# Patient Record
Sex: Female | Born: 1990 | Race: White | Hispanic: No | Marital: Married | Smoking: Former smoker
Health system: Southern US, Community
[De-identification: ages and names within clinical notes are randomized; demographics above are authoritative.]

## PROBLEM LIST (undated history)

## (undated) DIAGNOSIS — O1490 Unspecified pre-eclampsia, unspecified trimester: Secondary | ICD-10-CM

## (undated) DIAGNOSIS — I509 Heart failure, unspecified: Secondary | ICD-10-CM

## (undated) DIAGNOSIS — G43009 Migraine without aura, not intractable, without status migrainosus: Secondary | ICD-10-CM

## (undated) DIAGNOSIS — T7840XA Allergy, unspecified, initial encounter: Secondary | ICD-10-CM

## (undated) HISTORY — DX: Migraine without aura, not intractable, without status migrainosus: G43.009

## (undated) HISTORY — DX: Allergy, unspecified, initial encounter: T78.40XA

---

## 2013-01-22 ENCOUNTER — Emergency Department: Payer: Self-pay | Admitting: Emergency Medicine

## 2013-01-22 LAB — URINALYSIS, COMPLETE
Bilirubin,UR: NEGATIVE
Glucose,UR: NEGATIVE mg/dL (ref 0–75)
Ph: 7 (ref 4.5–8.0)
Protein: NEGATIVE
Squamous Epithelial: 4

## 2013-01-23 LAB — HCG, QUANTITATIVE, PREGNANCY: Beta Hcg, Quant.: 22288 m[IU]/mL — ABNORMAL HIGH

## 2013-01-23 LAB — GC/CHLAMYDIA PROBE AMP

## 2013-01-23 LAB — WET PREP, GENITAL

## 2013-09-13 ENCOUNTER — Inpatient Hospital Stay: Payer: Self-pay | Admitting: Obstetrics & Gynecology

## 2013-09-13 LAB — PROTEIN / CREATININE RATIO, URINE
Creatinine, Urine: 44.1 mg/dL (ref 30.0–125.0)
PROTEIN/CREAT. RATIO: 2925 mg/g{creat} — AB (ref 0–200)
Protein, Random Urine: 129 mg/dL — ABNORMAL HIGH (ref 0–12)

## 2013-09-13 LAB — CBC WITH DIFFERENTIAL/PLATELET
BASOS ABS: 0.1 10*3/uL (ref 0.0–0.1)
BASOS PCT: 0.5 %
EOS ABS: 0 10*3/uL (ref 0.0–0.7)
EOS PCT: 0.3 %
HCT: 29.4 % — AB (ref 35.0–47.0)
HGB: 10.1 g/dL — ABNORMAL LOW (ref 12.0–16.0)
Lymphocyte #: 1.6 10*3/uL (ref 1.0–3.6)
Lymphocyte %: 14.8 %
MCH: 31.3 pg (ref 26.0–34.0)
MCHC: 34.2 g/dL (ref 32.0–36.0)
MCV: 92 fL (ref 80–100)
Monocyte #: 1.1 x10 3/mm — ABNORMAL HIGH (ref 0.2–0.9)
Monocyte %: 9.7 %
NEUTROS ABS: 8.2 10*3/uL — AB (ref 1.4–6.5)
Neutrophil %: 74.7 %
Platelet: 72 10*3/uL — ABNORMAL LOW (ref 150–440)
RBC: 3.21 10*6/uL — AB (ref 3.80–5.20)
RDW: 13.1 % (ref 11.5–14.5)
WBC: 10.9 10*3/uL (ref 3.6–11.0)

## 2013-09-13 LAB — GC/CHLAMYDIA PROBE AMP

## 2013-09-14 LAB — ALT: SGPT (ALT): 15 U/L (ref 12–78)

## 2013-09-14 LAB — PLATELET COUNT: Platelet: 66 10*3/uL — ABNORMAL LOW (ref 150–440)

## 2013-09-14 LAB — LACTATE DEHYDROGENASE: LDH: 222 U/L (ref 81–246)

## 2013-09-20 ENCOUNTER — Emergency Department: Payer: Self-pay | Admitting: Emergency Medicine

## 2013-09-20 LAB — CBC
HCT: 36.2 % (ref 35.0–47.0)
HGB: 11.7 g/dL — ABNORMAL LOW (ref 12.0–16.0)
MCH: 30 pg (ref 26.0–34.0)
MCHC: 32.2 g/dL (ref 32.0–36.0)
MCV: 93 fL (ref 80–100)
Platelet: 403 10*3/uL (ref 150–440)
RBC: 3.89 10*6/uL (ref 3.80–5.20)
RDW: 13.7 % (ref 11.5–14.5)
WBC: 25.1 10*3/uL — AB (ref 3.6–11.0)

## 2013-09-20 LAB — BASIC METABOLIC PANEL
ANION GAP: 12 (ref 7–16)
BUN: 10 mg/dL (ref 7–18)
CALCIUM: 8.3 mg/dL — AB (ref 8.5–10.1)
CHLORIDE: 103 mmol/L (ref 98–107)
CO2: 20 mmol/L — AB (ref 21–32)
Creatinine: 0.87 mg/dL (ref 0.60–1.30)
EGFR (Non-African Amer.): >60
Glucose: 166 mg/dL — ABNORMAL HIGH (ref 65–99)
OSMOLALITY: 273 (ref 275–301)
Potassium: 3.8 mmol/L (ref 3.5–5.1)
SODIUM: 135 mmol/L — AB (ref 136–145)

## 2013-09-20 LAB — APTT: Activated PTT: 28.4 secs (ref 23.6–35.9)

## 2013-09-20 LAB — TROPONIN I: TROPONIN-I: 0.1 ng/mL — AB

## 2013-09-20 LAB — PROTIME-INR
INR: 0.9
Prothrombin Time: 12 secs (ref 11.5–14.7)

## 2013-09-20 LAB — MAGNESIUM: MAGNESIUM: 1.7 mg/dL — AB

## 2013-09-22 LAB — URINE CULTURE

## 2015-05-22 IMAGING — CR DG CHEST 1V PORT
1 series · 1 of 1 positions shown · non-contrast
Comparison: Portable exam 0010 hr without priors for comparison

CLINICAL DATA: Shortness of breath, postpartum from delivery
09/14/2013 question pulmonary edema or pulmonary embolism

EXAM:
PORTABLE CHEST - 1 VIEW

[ap]
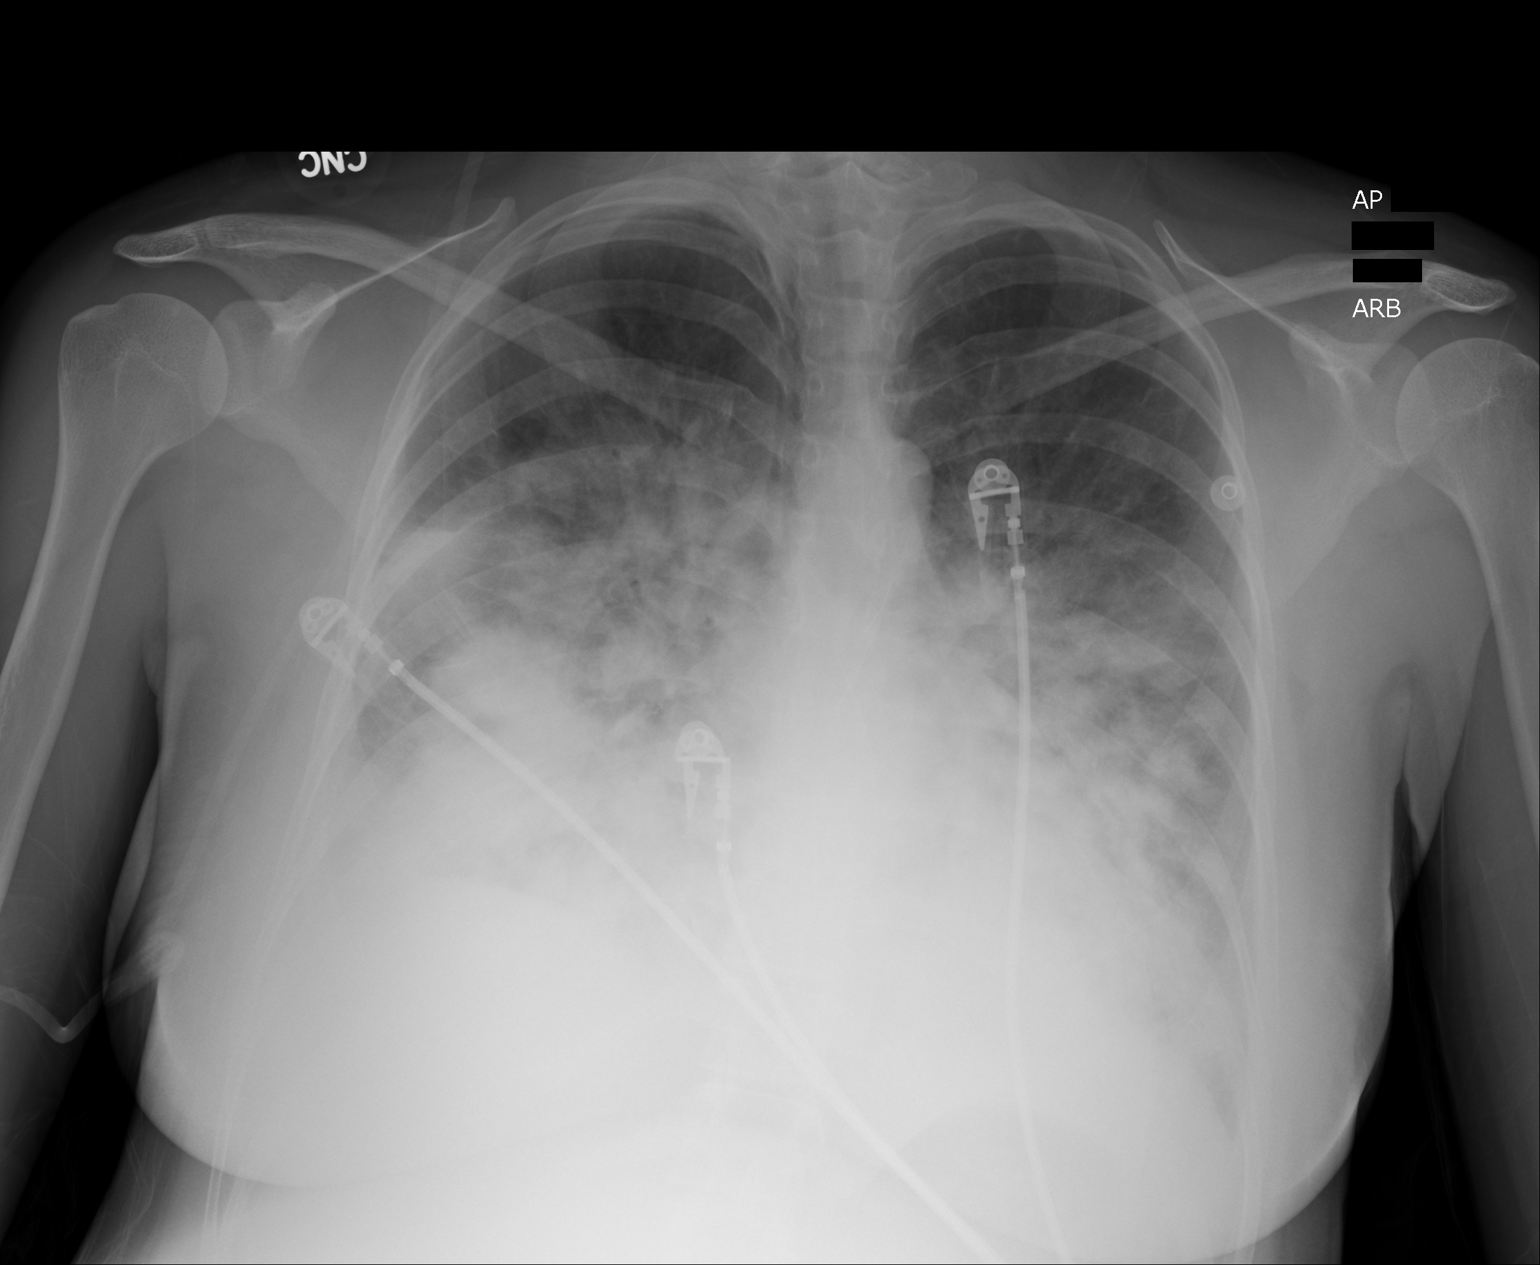

[1 of 1 positions shown; findings below may reference images not displayed]

FINDINGS: Upper normal heart size compatible with postpartum state.

Diffuse bilateral airspace infiltrates, greatest at the mid to lower
lungs.

Question minimal right pleural effusion.

No pneumothorax or acute osseous findings.
IMPRESSION: Diffuse bilateral airspace infiltrates at the mid to lower lungs and
question minimal right pleural effusion.

Differential diagnosis includes pulmonary edema and pneumonia,
pulmonary embolism not excluded, with amniotic fluid embolism
considered unlikely due to being 6 days post partum.

## 2016-11-03 ENCOUNTER — Encounter: Payer: Self-pay | Admitting: Emergency Medicine

## 2016-11-03 ENCOUNTER — Emergency Department
Admission: EM | Admit: 2016-11-03 | Discharge: 2016-11-04 | Disposition: A | Discharge status: Home / Self Care | Payer: Self-pay | Attending: Emergency Medicine | Admitting: Emergency Medicine

## 2016-11-03 DIAGNOSIS — J40 Bronchitis, not specified as acute or chronic: Secondary | ICD-10-CM | POA: Insufficient documentation

## 2016-11-03 DIAGNOSIS — J0100 Acute maxillary sinusitis, unspecified: Secondary | ICD-10-CM | POA: Insufficient documentation

## 2016-11-03 DIAGNOSIS — I509 Heart failure, unspecified: Secondary | ICD-10-CM | POA: Insufficient documentation

## 2016-11-03 DIAGNOSIS — Z87891 Personal history of nicotine dependence: Secondary | ICD-10-CM | POA: Insufficient documentation

## 2016-11-03 HISTORY — DX: Unspecified pre-eclampsia, unspecified trimester: O14.90

## 2016-11-03 HISTORY — DX: Heart failure, unspecified: I50.9

## 2016-11-03 MED ORDER — IPRATROPIUM-ALBUTEROL 0.5-2.5 (3) MG/3ML IN SOLN
3.0000 mL | Freq: Once | RESPIRATORY_TRACT | Status: AC
  Administered 2016-11-04: 3 mL via RESPIRATORY_TRACT
  Filled 2016-11-03: qty 3

## 2016-11-03 MED ORDER — BENZONATATE 100 MG PO CAPS
100.0000 mg | ORAL_CAPSULE | Freq: Once | ORAL | Status: AC
  Administered 2016-11-04: 100 mg via ORAL
  Filled 2016-11-03: qty 1

## 2016-11-03 MED ORDER — OXYMETAZOLINE HCL 0.05 % NA SOLN
1.0000 | Freq: Once | NASAL | Status: AC
  Administered 2016-11-04: 1 via NASAL
  Filled 2016-11-03: qty 15

## 2016-11-03 MED ORDER — BUTALBITAL-APAP-CAFFEINE 50-325-40 MG PO TABS
2.0000 | ORAL_TABLET | Freq: Once | ORAL | Status: AC
  Administered 2016-11-04: 2 via ORAL
  Filled 2016-11-03: qty 2

## 2016-11-03 NOTE — ED Triage Notes (Addendum)
Patient reports sore throat Wednesday.  Thursday with cough and sinus congestion and pain.  Reports cough productive with green sputum.  Lungs clear bilaterally.  No increase effort of breathing, no acute respiratory distress.

## 2016-11-03 NOTE — ED Provider Notes (Signed)
[facility] Emergency Department Provider Note   ____________________________________________   First MD Initiated Contact with Patient 11/03/16 2347     (approximate)  I have reviewed the triage vital signs and the nursing notes.   HISTORY  Chief Complaint Facial Pain and Anxiety    HPI Tonya Goodwin is a 26 y.o. female who comes into the hospital today with some headache, cough, shortness of breath and nasal congestion. The patient reports that she woke up on Wednesday with a sore throat. Thursday she was coughing and her chest felt tight. She reports that whenever she coughs she has pain in her chest. She also had some nasal congestion and facial pain. The patient reports that she's had a headache since Thursday although she's been taking Tylenol. She tried taking Coricidin HBP on Wednesday and Thursday but it did not help. The patient reports that she is starting to feel short of breath and feels like she cannot catch her breath. The patient rates her pain 8 out of 10 in intensity. She reports that her headache is constantly throbbing. She denies any sick contacts or fevers. She has had some bronchitis in the past but is concerned so she decided to come in to the hospital. She states that she has a lump in the back of her neck that is swollen and it hurts. She is here today for evaluation.   Past Medical History:  Diagnosis Date  . CHF (congestive heart failure) (HCC)   . Preeclampsia     There are no active problems to display for this patient.   History reviewed. No pertinent surgical history.  Prior to Admission medications   Medication Sig Start Date End Date Taking? Authorizing Provider  albuterol (PROVENTIL HFA;VENTOLIN HFA) 108 (90 Base) MCG/ACT inhaler Inhale 2 puffs into the lungs every 6 (six) hours as needed for wheezing or shortness of breath. 11/04/16   Rebecka Apley, MD  amoxicillin-clavulanate (AUGMENTIN) 875-125 MG tablet Take 1  tablet by mouth 2 (two) times daily. 11/04/16 11/14/16  Rebecka Apley, MD    Allergies Codeine  No family history on file.  Social History Social History  Substance Use Topics  . Smoking status: Former Games developer  . Smokeless tobacco: Not on file  . Alcohol use Yes    Review of Systems  Constitutional: No fever/chills Eyes: No visual changes. ENT: Sore throat, nasal congestion and facial pain Cardiovascular: Chest tightness Respiratory: Cough and shortness of breath. Gastrointestinal: No abdominal pain.  No nausea, no vomiting.  No diarrhea.  No constipation. Genitourinary: Negative for dysuria. Musculoskeletal: Negative for back pain. Skin: Negative for rash. Neurological: Headache   ____________________________________________   PHYSICAL EXAM:  VITAL SIGNS: ED Triage Vitals  Enc Vitals Group     BP 11/03/16 2116 130/87     Pulse Rate 11/03/16 2116 92     Resp 11/03/16 2116 18     Temp 11/03/16 2116 98.2 F (36.8 C)     Temp Source 11/03/16 2116 Oral     SpO2 11/03/16 2116 100 %     Weight 11/03/16 2115 140 lb (63.5 kg)     Height 11/03/16 2115  (1.6 m)     Head Circumference --      Peak Flow --      [facility]      Pain Edu? --      Excl. in GC? --     Constitutional:  Alert and oriented. Well appearing and in Mild distress. Eyes: Conjunctivae are normal. PERRL. EOMI. Head: Atraumatic. Nose: No congestion/rhinnorhea. Maxillary and ethmoid sinus tenderness to palpation Mouth/Throat: Mucous membranes are moist.  Oropharynx non-erythematous. Hematological/Lymphatic/Immunilogical: Tender left posterior cervical lymphadenopathy Cardiovascular: Normal rate, regular rhythm. Grossly normal heart sounds.  Good peripheral circulation. Respiratory: Normal respiratory effort.  No retractions. Lungs CTAB. Gastrointestinal: Soft and nontender. No distention. Positive bowel sounds Musculoskeletal: No lower extremity tenderness nor edema.     Neurologic:  Normal speech and language.  Skin:  Skin is warm, dry and intact.  Psychiatric: Mood and affect are normal.   ____________________________________________   LABS (all labs ordered are listed, but only abnormal results are displayed)  Labs Reviewed - No data to display ____________________________________________  EKG  ED ECG REPORT I, Rebecka Apley, the attending physician, personally viewed and interpreted this ECG.   Date: 11/04/2016  EKG Time: 0026  Rate: 96  Rhythm: normal sinus rhythm  Axis: normal  Intervals:none  ST&T Change: none  ____________________________________________  RADIOLOGY  CXR ____________________________________________   PROCEDURES  Procedure(s) performed: None  Procedures  Critical Care performed: No  ____________________________________________   INITIAL IMPRESSION / ASSESSMENT AND PLAN / ED COURSE  Pertinent labs & imaging results that were available during my care of the patient were reviewed by me and considered in my medical decision making (see chart for details).  This is a 26 year old female who comes into the hospital today with some nasal congestion and some chest tightness and some shortness of breath. The patient has been having symptoms since Wednesday. I will send the patient for a chest x-ray and I will also do an EKG on the patient given her history of CHF. I will give the patient some Afrin as well as Fioricet and a DuoNeb treatment. I feel that the patient has some sinusitis causing her symptoms. I will reassess the patient once I received her x-ray results and she's receive her medications.  Clinical Course as of Nov 04 56  Mon Nov 04, 2016  0041 No active cardiopulmonary disease. DG Chest 2 View [AW]    Clinical Course User Index [AW] Rebecka Apley, MD   The patient's breathing is improved with the Afrin as well as with the DuoNeb treatment. I will discharge the patient to home with some  medication for sinusitis and bronchitis. The patient should follow-up with the acute care clinic for further evaluation of her symptoms. The patient reports that she does feel a little bit jittery but I informed her that that does occur when she gets the albuterol treatment. Otherwise the patient has no other concerns. She'll be discharged home.  ____________________________________________   FINAL CLINICAL IMPRESSION(S) / ED DIAGNOSES  Final diagnoses:  Acute non-recurrent maxillary sinusitis  Bronchitis      NEW MEDICATIONS STARTED DURING THIS VISIT:  New Prescriptions   ALBUTEROL (PROVENTIL HFA;VENTOLIN HFA) 108 (90 BASE) MCG/ACT INHALER    Inhale 2 puffs into the lungs every 6 (six) hours as needed for wheezing or shortness of breath.   AMOXICILLIN-CLAVULANATE (AUGMENTIN) 875-125 MG TABLET    Take 1 tablet by mouth 2 (two) times daily.     Note:  This document was prepared using Dragon voice recognition software and may include unintentional dictation errors.    Rebecka Apley, MD 11/04/16 [phone]

## 2016-11-04 ENCOUNTER — Emergency Department: Payer: Self-pay

## 2016-11-04 MED ORDER — AMOXICILLIN-POT CLAVULANATE 875-125 MG PO TABS: 1.0000 | ORAL_TABLET | Freq: Two times a day (BID) | ORAL | 0 refills | Status: AC

## 2016-11-04 MED ORDER — ALBUTEROL SULFATE HFA 108 (90 BASE) MCG/ACT IN AERS: 2.0000 | INHALATION_SPRAY | Freq: Four times a day (QID) | RESPIRATORY_TRACT | 0 refills | Status: DC | PRN

## 2017-02-18 DIAGNOSIS — I429 Cardiomyopathy, unspecified: Secondary | ICD-10-CM | POA: Insufficient documentation

## 2017-08-13 ENCOUNTER — Ambulatory Visit: Payer: Self-pay

## 2018-05-12 LAB — HM PAP SMEAR: HM Pap smear: NEGATIVE

## 2018-07-06 IMAGING — CR DG CHEST 2V
2 series · 2 of 2 positions shown · non-contrast
Comparison: 09/20/2013

CLINICAL DATA: Sore throat on [REDACTED]

EXAM:
CHEST  2 VIEW

[chest pa]
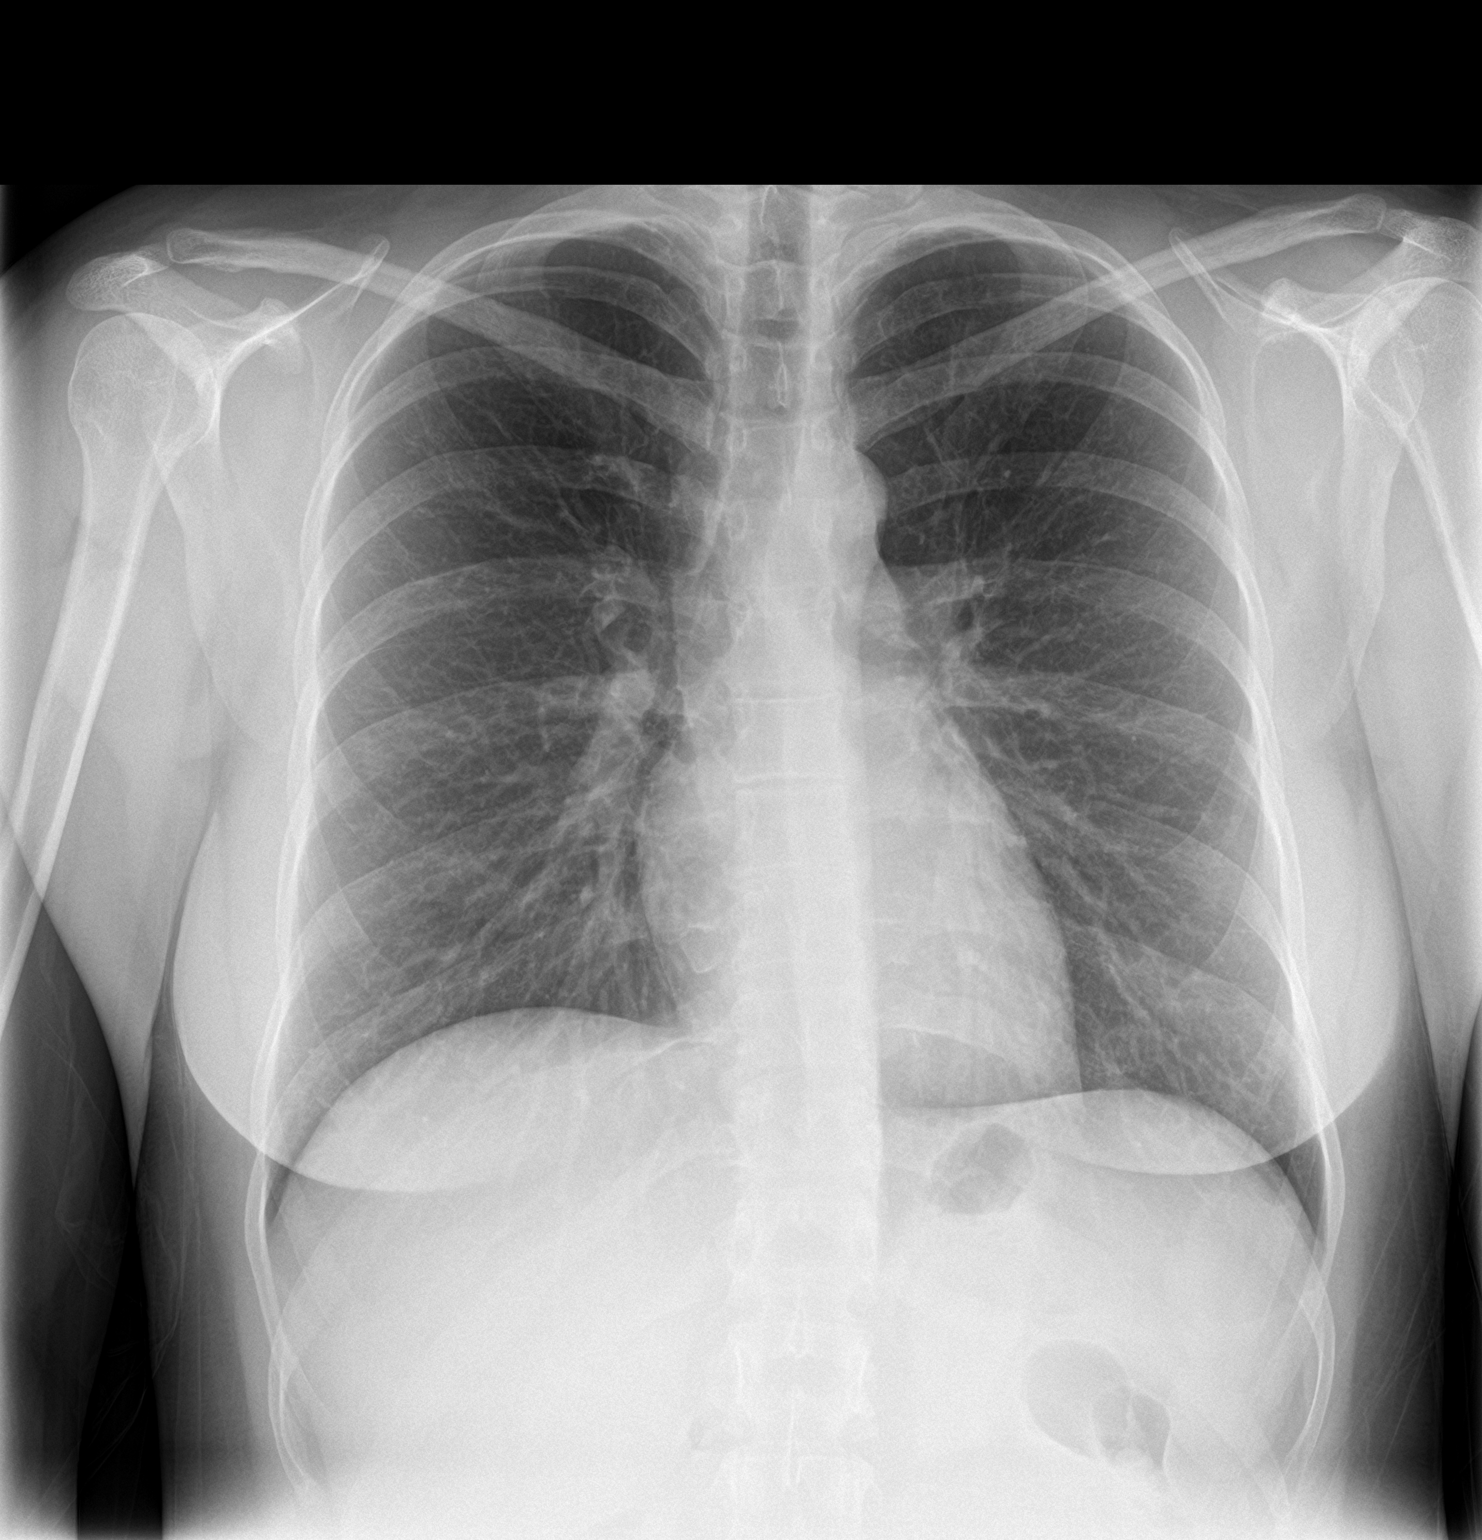

[chest lat]
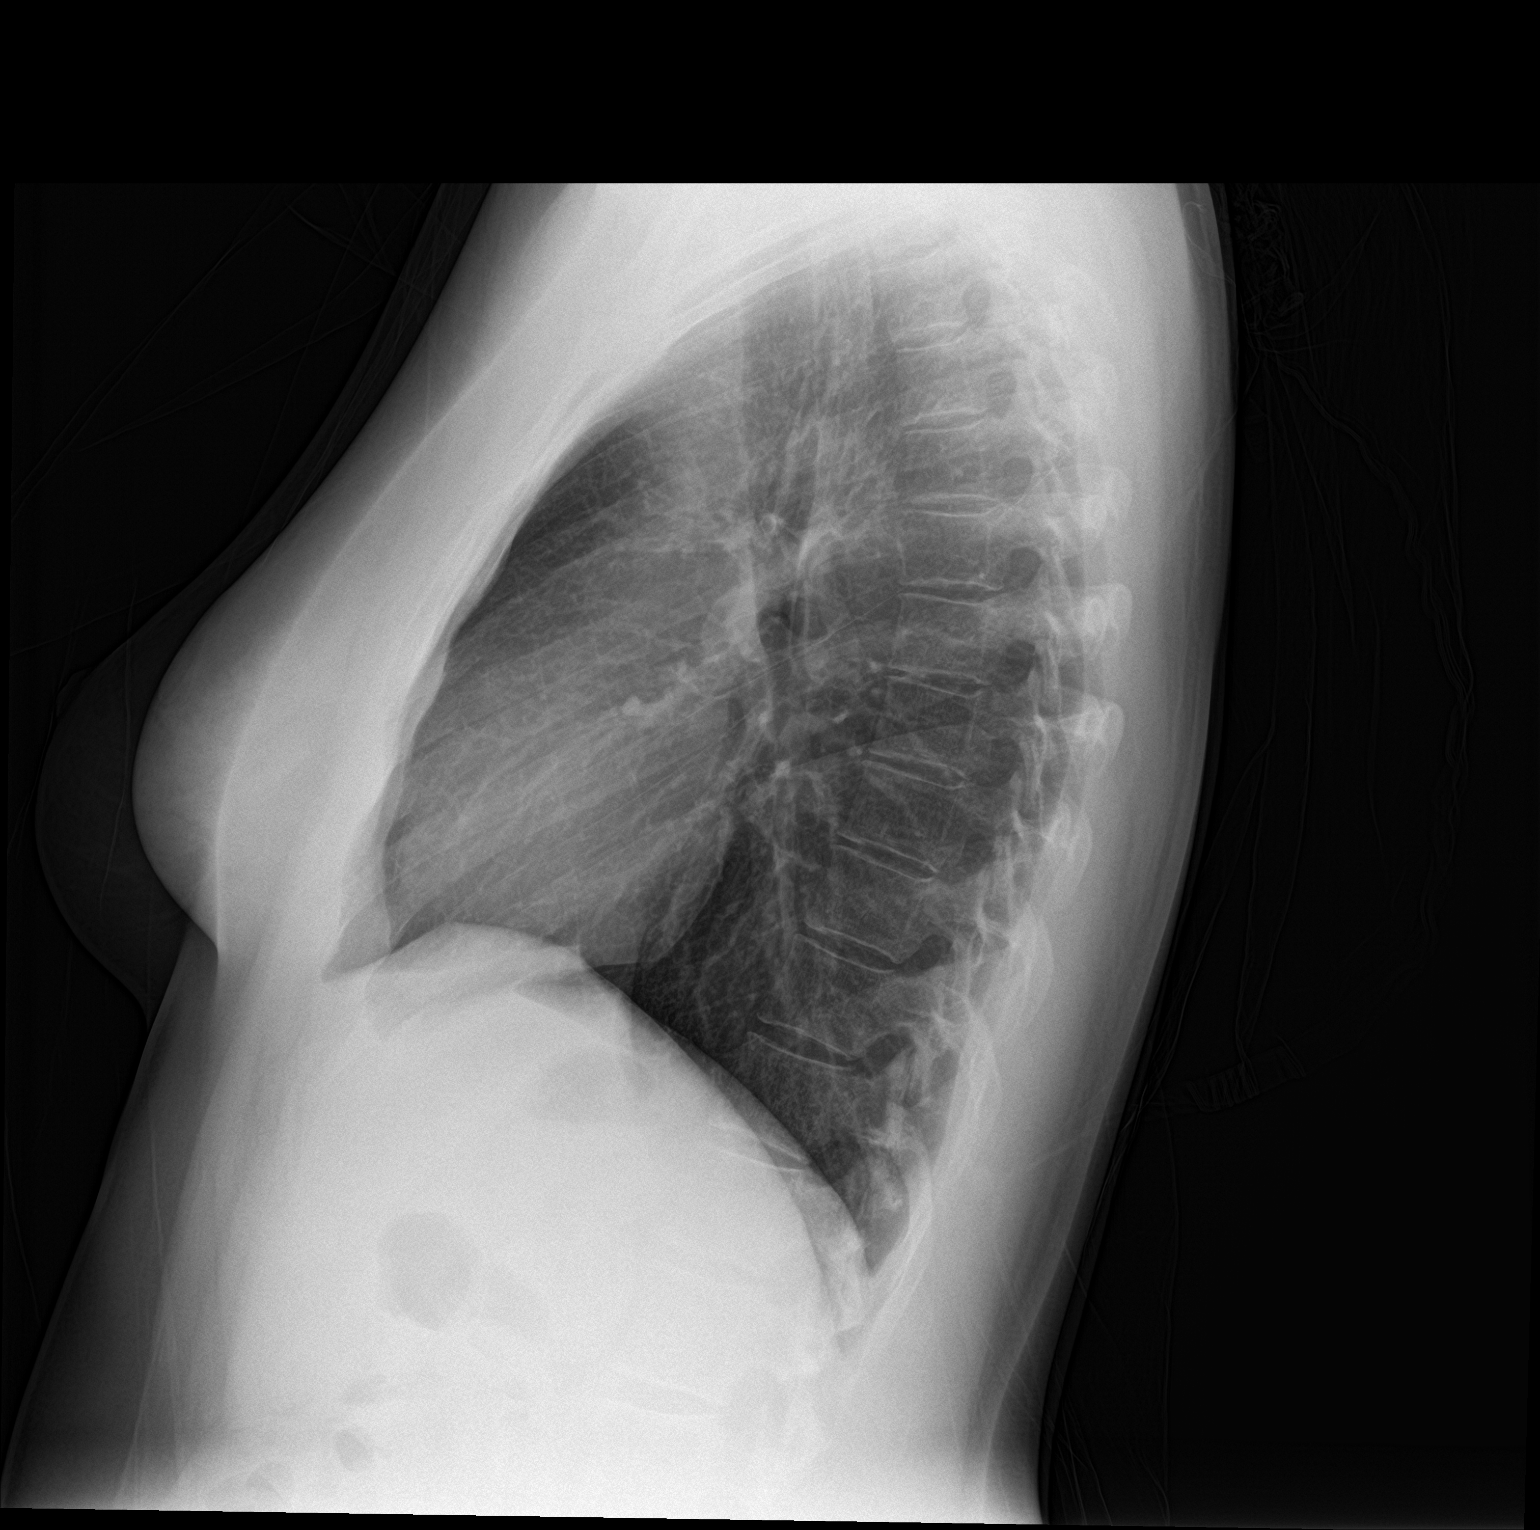

[2 of 2 positions shown; findings below may reference images not displayed]

FINDINGS: The heart size and mediastinal contours are within normal limits.
Both lungs are clear. The visualized skeletal structures are
unremarkable.
IMPRESSION: No active cardiopulmonary disease.

## 2019-02-03 ENCOUNTER — Other Ambulatory Visit: Payer: Self-pay

## 2019-02-03 ENCOUNTER — Ambulatory Visit (LOCAL_COMMUNITY_HEALTH_CENTER): Payer: Medicaid Other

## 2019-02-03 VITALS — BP 116/81 | Ht 63.0 in | Wt 169.5 lb

## 2019-02-03 DIAGNOSIS — Z3042 Encounter for surveillance of injectable contraceptive: Secondary | ICD-10-CM | POA: Diagnosis not present

## 2019-02-03 DIAGNOSIS — Z30013 Encounter for initial prescription of injectable contraceptive: Secondary | ICD-10-CM

## 2019-02-03 DIAGNOSIS — Z3009 Encounter for other general counseling and advice on contraception: Secondary | ICD-10-CM | POA: Diagnosis not present

## 2019-02-03 MED ORDER — MEDROXYPROGESTERONE ACETATE 150 MG/ML IM SUSP
150.0000 mg | Freq: Once | INTRAMUSCULAR | Status: AC
  Administered 2019-02-03: 150 mg via INTRAMUSCULAR

## 2019-02-03 NOTE — Progress Notes (Signed)
Physical at [facility] on 05/12/2018. Last depo at [facility] 10/28/2018; 14.0 weeks post depo. DMPA 150 mg IM per Jerline Pain, FNP order dated 05/12/2018.

## 2019-05-11 ENCOUNTER — Ambulatory Visit (LOCAL_COMMUNITY_HEALTH_CENTER): Payer: Medicaid Other

## 2019-05-11 ENCOUNTER — Other Ambulatory Visit: Payer: Self-pay

## 2019-05-11 VITALS — BP 119/81 | Ht 63.0 in | Wt 170.0 lb

## 2019-05-11 DIAGNOSIS — Z3009 Encounter for other general counseling and advice on contraception: Secondary | ICD-10-CM

## 2019-05-11 DIAGNOSIS — Z30013 Encounter for initial prescription of injectable contraceptive: Secondary | ICD-10-CM

## 2019-05-11 MED ORDER — MEDROXYPROGESTERONE ACETATE 150 MG/ML IM SUSP
150.0000 mg | Freq: Once | INTRAMUSCULAR | Status: AC
  Administered 2019-05-11: 150 mg via INTRAMUSCULAR

## 2019-05-11 MED ORDER — MULTI-VITAMIN/MINERALS PO TABS: 1.0000 | ORAL_TABLET | Freq: Every day | ORAL | 0 refills | Status: DC

## 2019-05-11 NOTE — Progress Notes (Signed)
Depo administered per 05/12/2018 written order of Jerline Pain FNP-BC. Administered in "right hip" per client request and injection tolerated without complaint. Folic acid counseling completed and MVI dispensed. Rich Number, RN

## 2019-07-29 ENCOUNTER — Other Ambulatory Visit: Payer: Self-pay | Admitting: Physician Assistant

## 2019-07-29 DIAGNOSIS — Z3042 Encounter for surveillance of injectable contraceptive: Secondary | ICD-10-CM

## 2019-07-29 MED ORDER — MEDROXYPROGESTERONE ACETATE 150 MG/ML IM SUSP
150.0000 mg | INTRAMUSCULAR | Status: DC
  Administered 2019-08-17: 10:00:00 150 mg via INTRAMUSCULAR

## 2019-07-29 NOTE — Progress Notes (Signed)
Per chart, patient with last RP 05/2018 and CBE and pap due 2022.  Provided that patient desires to continue with Depo and has no concerns, blood pressure is stable and is being followed for chronic conditions, can continue with Depo.

## 2019-07-30 ENCOUNTER — Ambulatory Visit: Payer: Medicaid Other

## 2019-08-17 ENCOUNTER — Other Ambulatory Visit: Payer: Self-pay

## 2019-08-17 ENCOUNTER — Ambulatory Visit (LOCAL_COMMUNITY_HEALTH_CENTER): Payer: Medicaid Other

## 2019-08-17 VITALS — BP 118/82 | Ht 63.0 in | Wt 174.0 lb

## 2019-08-17 DIAGNOSIS — Z30013 Encounter for initial prescription of injectable contraceptive: Secondary | ICD-10-CM

## 2019-08-17 DIAGNOSIS — Z3009 Encounter for other general counseling and advice on contraception: Secondary | ICD-10-CM | POA: Diagnosis not present

## 2019-08-17 NOTE — Progress Notes (Signed)
Depo given per Rolley Sims; tolerated well Richmond Campbell, RN

## 2019-11-18 ENCOUNTER — Other Ambulatory Visit: Payer: Self-pay

## 2019-11-18 ENCOUNTER — Ambulatory Visit (LOCAL_COMMUNITY_HEALTH_CENTER): Payer: Medicaid Other | Admitting: Physician Assistant

## 2019-11-18 ENCOUNTER — Encounter: Payer: Self-pay | Admitting: Physician Assistant

## 2019-11-18 VITALS — BP 120/81 | Ht 63.0 in | Wt 173.4 lb

## 2019-11-18 DIAGNOSIS — Z6830 Body mass index (BMI) 30.0-30.9, adult: Secondary | ICD-10-CM

## 2019-11-18 DIAGNOSIS — E669 Obesity, unspecified: Secondary | ICD-10-CM | POA: Insufficient documentation

## 2019-11-18 DIAGNOSIS — Z3009 Encounter for other general counseling and advice on contraception: Secondary | ICD-10-CM

## 2019-11-18 DIAGNOSIS — Z30013 Encounter for initial prescription of injectable contraceptive: Secondary | ICD-10-CM | POA: Diagnosis not present

## 2019-11-18 MED ORDER — MEDROXYPROGESTERONE ACETATE 150 MG/ML IM SUSP
150.0000 mg | INTRAMUSCULAR | Status: AC
  Administered 2019-11-18 – 2020-02-10 (×2): 150 mg via INTRAMUSCULAR

## 2019-11-18 MED ORDER — MULTIVITAMINS PO CAPS: 1.0000 | ORAL_CAPSULE | Freq: Every day | ORAL | 0 refills | Status: DC

## 2019-11-18 NOTE — Progress Notes (Signed)
Here today for RP and Depo. Last PE and Pap here was 05/12/2018. Last Depo was 08/17/2019 (13.2 weeks.). Declines STD screening today. Tawny Hopping, RN

## 2019-11-18 NOTE — Progress Notes (Signed)
Depo given and tolerated well. Accepted MVI's Tawny Hopping, RN

## 2019-11-18 NOTE — Progress Notes (Signed)
Family Planning Visit- Repeat Yearly Visit  Subjective:  Tonya Goodwin is a 29 y.o. G1P1001  being seen today for an well woman visit and to discuss family planning options.    She is currently using Depo-Provera injections for pregnancy prevention. Patient reports she does not want a pregnancy in the next year. Patient  has Cardiomyopathy (HCC) and Class 1 obesity with body mass index (BMI) of 30.0 to 30.9 in adult on their problem list.  Chief Complaint  Patient presents with  . Gynecologic Exam  . Contraception    Patient reports she feels well today. Wants to continue DMPA. Likes not having periods. Husband is considering vasectomy. Had routine cardiology eval recently for f/u postpartum cardiomyopathy (dx after 2105 delivery), which was deemed stable and meds were continued.  Patient denies concerns.   See flowsheet for other program required questions.   Body mass index is 30.72 kg/m. - Patient is eligible for diabetes screening based on BMI and age >58?  no HA1C ordered? not applicable  Patient reports 1 of partners in last year. Desires STI screening?  No - no exposure or symptoms   Has patient been screened once for HCV in the past?  No   No results found for: HCVAB  Does the patient have current of drug use, have a partner with drug use, and/or has been incarcerated since last result? No  If yes-- Screen for HCV through [facility]   Does the patient meet criteria for HBV testing? No  Criteria:  -Household, sexual or needle sharing contact with HBV -History of drug use -HIV positive -Those with known Hep C  ROS - see Routine Health Survey. Health Maintenance Due  Topic Date Due  . HIV Screening  Never done  . COVID-19 Vaccine (1) Never done    ROS  The following portions of the patient's history were reviewed and updated as appropriate: allergies, current medications, past family history, past medical history, past social history, past surgical history and  problem list. Problem list updated.  Objective:   Vitals:   11/18/19 1325  BP: 120/81  Weight: 173 lb 6.4 oz (78.7 kg)  Height: 5\' 3"  (1.6 m)    Physical Exam Vitals and nursing note reviewed.  Constitutional:      Appearance: She is obese.  HENT:     Head: Normocephalic.  Cardiovascular:     Rate and Rhythm: Normal rate and regular rhythm.     Heart sounds: Normal heart sounds. No murmur.  Pulmonary:     Effort: Pulmonary effort is normal. No respiratory distress.     Breath sounds: Normal breath sounds. No wheezing.  Abdominal:     General: Bowel sounds are normal.     Palpations: Abdomen is soft.     Tenderness: There is no abdominal tenderness. There is no guarding.     Hernia: No hernia is present.  Musculoskeletal:        General: Normal range of motion.     Cervical back: Normal range of motion.  Lymphadenopathy:     Cervical: No cervical adenopathy.  Skin:    General: Skin is warm and dry.     Findings: No lesion or rash.  Neurological:     Mental Status: She is alert and oriented to person, place, and time.  Psychiatric:        Behavior: Behavior normal.        Thought Content: Thought content normal.  Judgment: Judgment normal.    Declines pelvic/breast exam.   Assessment and Plan:  Tonya Goodwin is a 29 y.o. female [place] presenting to the [facility] Department for an yearly well woman exam/family planning visit  Contraception counseling: Reviewed all forms of birth control options in the tiered based approach. available including abstinence; over the counter/barrier methods; hormonal contraceptive medication including pill, patch, ring, injection,contraceptive implant, ECP; hormonal and nonhormonal IUDs; permanent sterilization options including vasectomy and the various tubal sterilization modalities. Risks, benefits, and typical effectiveness rates were reviewed.  Questions were answered.  Written information was also given to the  patient to review.  Patient desires DMPA, this was prescribed for patient. She will follow up in  3 mo for surveillance.  She was told to call with any further questions, or with any concerns about this method of contraception.  Emphasized use of condoms 100% of the time for STI prevention.  Patient was not offered ECP.   1. Family planning services Continue DMPA 150 mg IM today and repeat every 3 mo for 1 year. Discussed potential to discontinue if husband proceeds with vasectomy and has appropriate f/u sperm counts. Enc to establish PCP. Continue MVI with folic acid. Next Pap due 05/2021. - medroxyPROGESTERone (DEPO-PROVERA) injection 150 mg  2. Class 1 obesity with body mass index (BMI) of 30.0 to 30.9 in adult, unspecified obesity type, unspecified whether serious comorbidity present Enc diet/exercise and f/u with PCP.   Return in about 3 months (around 02/18/2020) for Routine DMPA injection.  No future appointments.  Lora Havens, PA-C

## 2020-02-10 ENCOUNTER — Other Ambulatory Visit: Payer: Self-pay

## 2020-02-10 ENCOUNTER — Ambulatory Visit (LOCAL_COMMUNITY_HEALTH_CENTER): Payer: Medicaid Other

## 2020-02-10 VITALS — BP 117/86 | Ht 63.0 in | Wt 174.0 lb

## 2020-02-10 DIAGNOSIS — Z3042 Encounter for surveillance of injectable contraceptive: Secondary | ICD-10-CM

## 2020-02-10 DIAGNOSIS — Z3009 Encounter for other general counseling and advice on contraception: Secondary | ICD-10-CM

## 2020-02-10 DIAGNOSIS — Z30013 Encounter for initial prescription of injectable contraceptive: Secondary | ICD-10-CM

## 2020-02-10 MED ORDER — MULTIVITAMINS PO CAPS: 1.0000 | ORAL_CAPSULE | Freq: Every day | ORAL | 0 refills | Status: AC

## 2020-02-10 NOTE — Progress Notes (Signed)
DMPA 150mg  given IM today per 11/18/2019 order by 11/20/2019, PA (thru 11/17/2020) and tolerated well. Patient is 12w 0d post last Depo. 11/19/2020, RN

## 2020-12-28 ENCOUNTER — Telehealth: Payer: Self-pay

## 2020-12-28 NOTE — Telephone Encounter (Signed)
Pt calling; wants a call back.  [phone]  Adv I could not adv her b/c we haven't seen her in over three years.

## 2021-08-15 ENCOUNTER — Ambulatory Visit: Payer: Self-pay | Admitting: Internal Medicine

## 2021-08-20 ENCOUNTER — Ambulatory Visit: Payer: Self-pay | Admitting: Internal Medicine

## 2021-12-26 ENCOUNTER — Encounter: Payer: Self-pay | Admitting: Physician Assistant

## 2021-12-26 ENCOUNTER — Ambulatory Visit (INDEPENDENT_AMBULATORY_CARE_PROVIDER_SITE_OTHER): Payer: Medicaid Other | Admitting: Physician Assistant

## 2021-12-26 VITALS — BP 116/79 | HR 85 | Temp 98.3°F | Resp 16 | Wt 166.7 lb

## 2021-12-26 DIAGNOSIS — B353 Tinea pedis: Secondary | ICD-10-CM

## 2021-12-26 DIAGNOSIS — O903 Peripartum cardiomyopathy: Secondary | ICD-10-CM | POA: Diagnosis not present

## 2021-12-26 DIAGNOSIS — B351 Tinea unguium: Secondary | ICD-10-CM

## 2021-12-26 DIAGNOSIS — L719 Rosacea, unspecified: Secondary | ICD-10-CM

## 2021-12-26 DIAGNOSIS — E669 Obesity, unspecified: Secondary | ICD-10-CM | POA: Diagnosis not present

## 2021-12-26 NOTE — Progress Notes (Signed)
I,Kensley Valladares Robinson,acting as a Education administrator for [employer], PA-C.,have documented all relevant documentation on the behalf of Tonya Speak, PA-C,as directed by  [employer], PA-C while in the presence of [employer], PA-C.  New Patient Office Visit  Subjective    Patient ID: Tonya Goodwin, female    DOB: 06/17/1991  Age: 31 y.o. MRN: [id number]  CC:  Chief Complaint  Patient presents with   Establish Care    HPI [name] presents to establish care.  Patient has no complaints today and is aware she is due for pap.    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 Her son is 14 y.o.  Outpatient Encounter Medications as of 12/26/2021  Medication Sig   carvedilol (COREG) 3.125 MG tablet Take 3.125 mg by mouth 2 (two) times daily with a meal.   lisinopril (ZESTRIL) 5 MG tablet Take 5 mg by mouth daily. Pt unsure of dosage   Multiple Vitamin (MULTIVITAMIN) capsule Take 1 capsule by mouth daily.   albuterol (PROVENTIL HFA;VENTOLIN HFA) 108 (90 Base) MCG/ACT inhaler Inhale 2 puffs into the lungs every 6 (six) hours as needed for wheezing or shortness of breath.   Multiple Vitamins-Minerals (MULTIVITAMIN WITH MINERALS) tablet Take 1 tablet by mouth daily.   No facility-administered encounter medications on file as of 12/26/2021.    Past Medical History:  Diagnosis Date   Allergy    CHF (congestive heart failure) (HCC)    Migraine headache without aura    Preeclampsia     Past Surgical History:  Procedure Laterality Date   CESAREAN SECTION      Family History  Problem Relation Age of Onset   Heart disease Mother    Hypertension Father    Migraines Maternal Grandmother    Heart disease Maternal Grandfather     Social History   Socioeconomic History   Marital status: Married    Spouse name: Not on file   Number of children: 1   Years of education: 10   Highest education level: Not on file   Occupational History   Not on file  Tobacco Use   Smoking status: Former   Smokeless tobacco: Never  Substance and Sexual Activity   Alcohol use: Yes   Drug use: Never   Sexual activity: Yes    Comment: vesectomy  Other Topics Concern   Not on file  Social History Narrative   Not on file   Social Determinants of Health   Financial Resource Strain: Not on file  Food Insecurity: Not on file  Transportation Needs: Not on file  Physical Activity: Not on file  Stress: Not on file  Social Connections: Not on file  Intimate Partner Violence: Not At Risk (11/18/2019)   Humiliation, Afraid, Rape, and Kick questionnaire    Fear of Current or Ex-Partner: No    Emotionally Abused: No    Physically Abused: No    Sexually Abused: No    Review of Systems  All other systems reviewed and are negative.    Objective    BP 116/79 (BP Location: Right Arm, Patient Position: Sitting, Cuff Size: Normal)   Pulse 85   Temp 98.3 F (36.8 C) (Oral)   Resp 16   Wt 166 lb 11.2 oz (75.6 kg)   SpO2 100%   BMI 29.53 kg/m   Physical Exam Vitals reviewed.  Constitutional:      General: She is not in  acute distress.    Appearance: Normal appearance. She is well-developed. She is obese. She is not diaphoretic.  HENT:     Head: Normocephalic and atraumatic.     Right Ear: Tympanic membrane, ear canal and external ear normal.     Left Ear: Tympanic membrane, ear canal and external ear normal.     Nose: Nose normal.     Mouth/Throat:     Mouth: Mucous membranes are moist.     Pharynx: Oropharynx is clear. No oropharyngeal exudate.  Eyes:     General: No scleral icterus.    Conjunctiva/sclera: Conjunctivae normal.     Pupils: Pupils are equal, round, and reactive to light.  Neck:     Thyroid: No thyromegaly.  Cardiovascular:     Rate and Rhythm: Normal rate and regular rhythm.     Pulses: Normal pulses.     Heart sounds: Normal heart sounds. No murmur heard. Pulmonary:     Effort:  Pulmonary effort is normal. No respiratory distress.     Breath sounds: Normal breath sounds. No wheezing or rales.  Abdominal:     General: Abdomen is flat. Bowel sounds are normal. There is no distension.     Palpations: Abdomen is soft.     Tenderness: There is no abdominal tenderness.  Musculoskeletal:        General: No deformity. Normal range of motion.     Cervical back: Normal range of motion and neck supple.     Right lower leg: No edema.     Left lower leg: No edema.  Lymphadenopathy:     Cervical: No cervical adenopathy.  Skin:    General: Skin is warm and dry.     Findings: No rash.     Comments:  persistent erythema with telangiectases/face  pruritic erythematous erosion/scales between toes, extension onto soles, sides/dorsum of left foot (moccasin distribution)  Firm, minimally rough, 19mm, follicle-based papules, some with perilesional erythema. Symmetric. Located on the lateral-proximal aspects of the arms   Neurological:     General: No focal deficit present.     Mental Status: She is alert and oriented to person, place, and time. Mental status is at baseline.     Sensory: No sensory deficit.     Motor: No weakness.     Gait: Gait normal.  Psychiatric:        Mood and Affect: Mood normal.        Behavior: Behavior normal.        Thought Content: Thought content normal.        Judgment: Judgment normal.         Assessment & Plan:   Problem List Items Addressed This Visit   None  1. Obesity (BMI 30-39.9) BMI ~ 30. Initial workup: - CBC with Differential/Platelet - Comprehensive metabolic panel - TSH Lifestyle modifications suggested via healthy diet and regular exercise  2. Tinea pedis, left Advised to use Aluminum acetate soak (Burow's solution; [place]ome[place], 1 pack to 1 quart warm water) to decrease itching and acute eczematous reaction Antifungal cream of choice BID after soaks (allylamines slightly more effective than azoles)Advised to use  -  Aluminum acetate soak (Burow's solution; [place]boro, 1 pack to 1 quart warm water) to decrease itching and acute eczematous reaction  - Antifungal cream of choice BID after soaks (allylamines slightly more effective than azoles) -  Perform careful removal of dead/thickened skin after soaking or bathing - Treat shoes with antifungal powders - Avoid occlusive footwear Perform careful removal  of dead/thickened skin after soaking or bathing Treat shoes with antifungal powders Avoid occlusive footwear  3. Onychomycosis Advised to use  - Aluminum acetate soak (Burow's solution; [place]omeboro, 1 pack to 1 quart warm water) to decrease itching and acute eczematous reaction  - Antifungal cream of choice BID after soaks (allylamines slightly more effective than azoles) -  Perform careful removal of dead/thickened skin after soaking or bathing - Treat shoes with antifungal powders - Avoid occlusive footwear  4. Postpartum cardiomyopathy Chronic. Stable Continue current regimen Managed by Cardiology  5. Rosacea Recommended: -proper skin care and photoprotection, use of mild, nondrying soap ; -avoidance of triggers -avoid oil-based cosmetics -support physical fitness. Reassured that rosacea is completely unrelated to poor hygiene Topical steroids should not be used because they may aggravate rosacea.  6. Keratosis pilaris? Advise to moisturize. Use emollients.   Use Lactic acid 12% creams/lotions (e.g., ammonium lactate: AmLactin Ultra, Lac-Hydrin) Urea (in 40-50% topical preparations)  CPE in a mo   The patient was advised to call back or seek an in-person evaluation if the symptoms worsen or if the condition fails to improve as anticipated.  I discussed the assessment and treatment plan with the patient. The patient was provided an opportunity to ask questions and all were answered. The patient agreed with the plan and demonstrated an understanding of the instructions.  The entirety of the  information documented in the History of Present Illness, Review of Systems and Physical Exam were personally obtained by me. Portions of this information were initially documented by the CMA and reviewed by me for thoroughness and accuracy.  Portions of this note were created using dictation software and may contain  typographical errors.   Debera Lat, [facility], MMS [facility] [phone] (phone) [phone] (fax)

## 2021-12-27 LAB — CBC WITH DIFFERENTIAL/PLATELET
Basos: 1 %
EOS (ABSOLUTE): 0.1 10*3/uL (ref 0.0–0.4)
Eos: 1 %
Hematocrit: 38.1 % (ref 34.0–46.6)
Immature Grans (Abs): 0 10*3/uL (ref 0.0–0.1)
MCH: 33 pg (ref 26.6–33.0)
MCHC: 34.4 g/dL (ref 31.5–35.7)
MCV: 96 fL (ref 79–97)
Monocytes Absolute: 0.7 10*3/uL (ref 0.1–0.9)
Monocytes: 8 %
Neutrophils Absolute: 5.4 10*3/uL (ref 1.4–7.0)
Neutrophils: 63 %
Platelets: 237 10*3/uL (ref 150–450)
WBC: 8.4 10*3/uL (ref 3.4–10.8)

## 2021-12-27 LAB — COMPREHENSIVE METABOLIC PANEL
Albumin: 5.1 g/dL — ABNORMAL HIGH (ref 3.9–5.0)
BUN: 10 mg/dL (ref 6–20)
Bilirubin Total: 0.3 mg/dL (ref 0.0–1.2)
Glucose: 95 mg/dL (ref 70–99)
Potassium: 4.4 mmol/L (ref 3.5–5.2)
Sodium: 142 mmol/L (ref 134–144)
eGFR: 123 mL/min/{1.73_m2} (ref >59)

## 2021-12-27 LAB — TSH: TSH: 1.92 u[IU]/mL (ref 0.450–4.500)

## 2021-12-27 NOTE — Progress Notes (Signed)
Hello Tonya Goodwin ,   Your labwork results all are within normal limits.   Please, sign up for my chart. You will be able to access the office note with detailed instructions for treatment  Any questions please reach out to the office or message me on MyChart!  Best,  Debera Lat, PA-C

## 2022-01-24 NOTE — Progress Notes (Deleted)
Complete physical exam   Patient: Tonya Goodwin   DOB: 1990/09/21   30 y.o. Female  MRN: [id number] Visit Date: 01/25/2022  Today's healthcare provider: Debera Lat, PA-C   No chief complaint on file.  Subjective    Tonya Goodwin is a 31 y.o. female who presents today for a complete physical exam.  She reports consuming a {diet types:17450} diet. {Exercise:19826} She generally feels {well/fairly well/poorly:18703}. She reports sleeping {well/fairly well/poorly:18703}. She {does/does not:200015} have additional problems to discuss today.  HPI  ***  Past Medical History:  Diagnosis Date   Allergy    CHF (congestive heart failure) (HCC)    Migraine headache without aura    Preeclampsia    Past Surgical History:  Procedure Laterality Date   CESAREAN SECTION     Social History   Socioeconomic History   Marital status: Married    Spouse name: Not on file   Number of children: 1   Years of education: 10   Highest education level: Not on file  Occupational History   Not on file  Tobacco Use   Smoking status: Former   Smokeless tobacco: Never  Substance and Sexual Activity   Alcohol use: Yes   Drug use: Never   Sexual activity: Yes    Comment: vesectomy  Other Topics Concern   Not on file  Social History Narrative   Not on file   Social Determinants of Health   Financial Resource Strain: Not on file  Food Insecurity: Not on file  Transportation Needs: Not on file  Physical Activity: Not on file  Stress: Not on file  Social Connections: Not on file  Intimate Partner Violence: Not At Risk (11/18/2019)   Humiliation, Afraid, Rape, and Kick questionnaire    Fear of Current or Ex-Partner: No    Emotionally Abused: No    Physically Abused: No    Sexually Abused: No   Family Status  Relation Name Status   Mother  (Not Specified)   Father  (Not Specified)   MGM  (Not Specified)   MGF  (Not Specified)   Family History  Problem Relation Age of Onset    Heart disease Mother    Hypertension Father    Migraines Maternal Grandmother    Heart disease Maternal Grandfather    Allergies  Allergen Reactions   Codeine Hives    Patient Care Team: Patient, No Pcp Per as PCP - General (General Practice)   Medications: Outpatient Medications Prior to Visit  Medication Sig   carvedilol (COREG) 3.125 MG tablet Take 3.125 mg by mouth 2 (two) times daily with a meal.   lisinopril (ZESTRIL) 5 MG tablet Take 5 mg by mouth daily. Pt unsure of dosage   Multiple Vitamin (MULTIVITAMIN) capsule Take 1 capsule by mouth daily.   No facility-administered medications prior to visit.    Review of Systems  {Labs  Heme  Chem  Endocrine  Serology  Results Review (optional):23779}  Objective    There were no vitals taken for this visit. {Show previous vital signs (optional):23777}   Physical Exam  ***  Last depression screening scores    12/26/2021    1:58 PM 11/18/2019    1:33 PM  PHQ 2/9 Scores  PHQ - 2 Score 0 0  PHQ- 9 Score 0    Last fall risk screening    12/26/2021    1:58 PM  Fall Risk   Falls in the past year? 0  Number falls  in past yr: 0  Injury with Fall? 0  Follow up Falls evaluation completed   Last Audit-C alcohol use screening    12/26/2021    1:58 PM  Alcohol Use Disorder Test (AUDIT)  1. How often do you have a drink containing alcohol? 3  2. How many drinks containing alcohol do you have on a typical day when you are drinking? 2  3. How often do you have six or more drinks on one occasion? 2  AUDIT-C Score 7   A score of 3 or more in women, and 4 or more in men indicates increased risk for alcohol abuse, EXCEPT if all of the points are from question 1   No results found for any visits on 01/25/22.  Assessment & Plan    Routine Health Maintenance and Physical Exam  Exercise Activities and Dietary recommendations  Goals   None      There is no immunization history on file for this patient.  Health  Maintenance  Topic Date Due   COVID-19 Vaccine (1) Never done   HIV Screening  Never done   Hepatitis C Screening  Never done   PAP-Cervical Cytology Screening  05/18/2021   PAP SMEAR-Modifier  05/18/2021   INFLUENZA VACCINE  02/05/2022   TETANUS/TDAP  07/09/2023   HPV VACCINES  Aged Out    Discussed health benefits of physical activity, and encouraged her to engage in regular exercise appropriate for her age and condition.  ***  No follow-ups on file.     {provider attestation***:1}   Debera Lat, Cordelia Poche  [facility] [phone] (phone) [phone] (fax)  [facility]

## 2022-01-25 ENCOUNTER — Encounter: Payer: Medicaid Other | Admitting: Physician Assistant

## 2022-01-25 DIAGNOSIS — B351 Tinea unguium: Secondary | ICD-10-CM

## 2022-01-25 DIAGNOSIS — L719 Rosacea, unspecified: Secondary | ICD-10-CM

## 2022-01-25 DIAGNOSIS — Z0000 Encounter for general adult medical examination without abnormal findings: Secondary | ICD-10-CM

## 2022-01-25 DIAGNOSIS — B353 Tinea pedis: Secondary | ICD-10-CM

## 2022-01-25 DIAGNOSIS — E669 Obesity, unspecified: Secondary | ICD-10-CM

## 2022-01-25 DIAGNOSIS — O903 Peripartum cardiomyopathy: Secondary | ICD-10-CM
